# Patient Record
Sex: Female | Born: 1965 | Race: White | Hispanic: No | Marital: Single | Smoking: Former smoker
Health system: Southern US, Community
[De-identification: ages and names within clinical notes are randomized; demographics above are authoritative.]

## PROBLEM LIST (undated history)

## (undated) DIAGNOSIS — I10 Essential (primary) hypertension: Secondary | ICD-10-CM

## (undated) DIAGNOSIS — F419 Anxiety disorder, unspecified: Secondary | ICD-10-CM

## (undated) DIAGNOSIS — E669 Obesity, unspecified: Secondary | ICD-10-CM

## (undated) DIAGNOSIS — E785 Hyperlipidemia, unspecified: Secondary | ICD-10-CM

## (undated) HISTORY — PX: ABDOMINOPLASTY: SUR9

## (undated) HISTORY — DX: Hyperlipidemia, unspecified: E78.5

## (undated) HISTORY — PX: ANKLE SURGERY: SHX546

## (undated) HISTORY — DX: Obesity, unspecified: E66.9

## (undated) HISTORY — PX: BREAST REDUCTION SURGERY: SHX8

---

## 2007-09-13 ENCOUNTER — Other Ambulatory Visit: Admission: RE | Admit: 2007-09-13 | Discharge: 2007-09-13 | Payer: Self-pay | Admitting: Obstetrics and Gynecology

## 2008-03-05 ENCOUNTER — Encounter: Admission: RE | Admit: 2008-03-05 | Discharge: 2008-03-05 | Payer: Self-pay | Admitting: Obstetrics and Gynecology

## 2008-05-07 ENCOUNTER — Ambulatory Visit (HOSPITAL_BASED_OUTPATIENT_CLINIC_OR_DEPARTMENT_OTHER): Admission: RE | Admit: 2008-05-07 | Discharge: 2008-05-07 | Payer: Self-pay | Admitting: Family Medicine

## 2008-06-10 ENCOUNTER — Other Ambulatory Visit: Admission: RE | Admit: 2008-06-10 | Discharge: 2008-06-10 | Payer: Self-pay | Admitting: Interventional Radiology

## 2008-06-10 ENCOUNTER — Encounter (INDEPENDENT_AMBULATORY_CARE_PROVIDER_SITE_OTHER): Payer: Self-pay | Admitting: Interventional Radiology

## 2008-06-10 ENCOUNTER — Encounter: Admission: RE | Admit: 2008-06-10 | Discharge: 2008-06-10 | Payer: Self-pay | Admitting: Internal Medicine

## 2009-05-04 ENCOUNTER — Ambulatory Visit (HOSPITAL_BASED_OUTPATIENT_CLINIC_OR_DEPARTMENT_OTHER): Admission: RE | Admit: 2009-05-04 | Discharge: 2009-05-04 | Payer: Self-pay | Admitting: Internal Medicine

## 2009-05-04 ENCOUNTER — Ambulatory Visit: Payer: Self-pay | Admitting: Diagnostic Radiology

## 2010-07-05 ENCOUNTER — Other Ambulatory Visit: Admission: RE | Admit: 2010-07-05 | Discharge: 2010-07-05 | Payer: Self-pay | Admitting: Obstetrics and Gynecology

## 2010-08-02 ENCOUNTER — Encounter: Admission: RE | Admit: 2010-08-02 | Discharge: 2010-08-02 | Payer: Self-pay | Admitting: Internal Medicine

## 2010-10-27 ENCOUNTER — Encounter
Admission: RE | Admit: 2010-10-27 | Discharge: 2010-10-27 | Payer: Self-pay | Source: Home / Self Care | Attending: Obstetrics and Gynecology | Admitting: Obstetrics and Gynecology

## 2010-10-30 ENCOUNTER — Encounter: Payer: Self-pay | Admitting: Obstetrics and Gynecology

## 2011-01-31 ENCOUNTER — Other Ambulatory Visit (HOSPITAL_COMMUNITY): Payer: 59

## 2011-02-02 ENCOUNTER — Encounter (HOSPITAL_COMMUNITY)
Admission: RE | Admit: 2011-02-02 | Discharge: 2011-02-02 | Disposition: A | Discharge status: Home / Self Care | Payer: 59 | Source: Ambulatory Visit | Attending: Obstetrics and Gynecology | Admitting: Obstetrics and Gynecology

## 2011-02-02 LAB — COMPREHENSIVE METABOLIC PANEL
ALT: 41 U/L — ABNORMAL HIGH (ref 0–35)
Alkaline Phosphatase: 48 U/L (ref 39–117)
BUN: 11 mg/dL (ref 6–23)
CO2: 27 mEq/L (ref 19–32)
Chloride: 101 mEq/L (ref 96–112)
GFR calc non Af Amer: >60 mL/min (ref >60)
Glucose, Bld: 83 mg/dL (ref 70–99)
Potassium: 3.7 mEq/L (ref 3.5–5.1)
Sodium: 136 mEq/L (ref 135–145)
Total Bilirubin: 0.4 mg/dL (ref 0.3–1.2)
Total Protein: 7 g/dL (ref 6.0–8.3)

## 2011-02-02 LAB — CBC
HCT: 38.5 % (ref 36.0–46.0)
Hemoglobin: 13.4 g/dL (ref 12.0–15.0)
MCV: 84.2 fL (ref 78.0–100.0)
RDW: 13.8 % (ref 11.5–15.5)
WBC: 8 10*3/uL (ref 4.0–10.5)

## 2011-02-02 LAB — SURGICAL PCR SCREEN
MRSA, PCR: NEGATIVE
Staphylococcus aureus: POSITIVE — AB

## 2011-02-07 ENCOUNTER — Ambulatory Visit (HOSPITAL_COMMUNITY)
Admission: RE | Admit: 2011-02-07 | Discharge: 2011-02-07 | Disposition: A | Discharge status: Home / Self Care | Payer: 59 | Source: Ambulatory Visit | Attending: Obstetrics and Gynecology | Admitting: Obstetrics and Gynecology

## 2011-02-07 ENCOUNTER — Other Ambulatory Visit: Payer: Self-pay | Admitting: Obstetrics and Gynecology

## 2011-02-07 DIAGNOSIS — N8000 Endometriosis of the uterus, unspecified: Secondary | ICD-10-CM | POA: Insufficient documentation

## 2011-02-07 DIAGNOSIS — Z01812 Encounter for preprocedural laboratory examination: Secondary | ICD-10-CM | POA: Insufficient documentation

## 2011-02-07 DIAGNOSIS — Z01818 Encounter for other preprocedural examination: Secondary | ICD-10-CM | POA: Insufficient documentation

## 2011-02-07 DIAGNOSIS — N920 Excessive and frequent menstruation with regular cycle: Secondary | ICD-10-CM | POA: Insufficient documentation

## 2011-02-07 DIAGNOSIS — D251 Intramural leiomyoma of uterus: Secondary | ICD-10-CM | POA: Insufficient documentation

## 2011-02-16 NOTE — Op Note (Signed)
NAMEMARDELLE, PANDOLFI       ACCOUNT NO.:  [id number]  MEDICAL RECORD NO.:  [id number]           PATIENT TYPE:  O  LOCATION:  9303                          FACILITY:  [facility]  PHYSICIAN:  Judithann Villamar P. Shirleymae Hauth, M.D.DATE OF BIRTH:  [birth year]  DATE OF PROCEDURE:  02/07/2011 DATE OF DISCHARGE:  02/07/2011                              OPERATIVE REPORT   PREOPERATIVE DIAGNOSES:  Menorrhagia, uterine fibroids.  POSTOPERATIVE DIAGNOSES:  Menorrhagia, uterine fibroids, path pending.  PROCEDURE:  Robotic-assisted total laparoscopic hysterectomy.  SURGEON:  Lanee Chain P. Rivkah Wolz, MD  ASSISTANT:  Lum Keas, MD  ANESTHESIA:  General endotracheal.  ESTIMATED BLOOD LOSS:  50 mL.  COMPLICATIONS:  None.  PROCEDURE:  The patient was taken to the operating room and after the induction of adequate general endotracheal anesthesia was placed in the dorsal lithotomy position and prepped and draped in usual fashion.  A posterior weighted and anterior Deaver retractor were placed.  The cervix was grasped on its anterior lip with a single-tooth tenaculum. The uterus sounded to 10 cm.  The cervix was dilated to #23 Shawnie Pons.  A 10- cm RUMI manipulator with a size 4 KOH ring was placed around the cervix and the balloon in the manipulator inflated. A Foley catheter was then placed.  Attention was next turned to the abdomen.  A small area above the umbilicus was infiltrated with 0.25% Marcaine and incised with a knife vertically in the crease of the umbilicus.  A Veress needle was inserted into peritoneal space.  Proper placement was tested by noting a negative aspirate and free flow of saline through the Veress needle again with a negative aspirate and then by noting the response of a drop of saline placed at the hub of the Veress needle to negative pressure as the abdominal wall was elevated, pneumoperitoneum was created with 2 liters of CO2.  A 12-mm bladed trocar was then inserted into  peritoneal space and its proper placement noted with the laparoscope.  The sites for the accessory ports were marked with a marking pen and the abdomen was transilluminated to check for vasculature.  The sites were infiltrated with 0.25% Marcaine and incised.  The robotic ports were 10- cm to the right and left of the umbilicus and the assistant port in the right lower quadrant.  All the trocars were inserted under direct visualization, 8-mm robotic ports and a 12-mm assistance port.  The patient was then placed in steep Trendelenburg.  The robot was brought in and docked from the side.  In port 1, monopolar scissors were placed under direct visualization and in port 2, the PK gyrus was placed also under direct visualization.  The pelvis was inspected.  The uterus was freely mobile but did contain an approximately 4-cm fundal fibroid.  The tubes and ovaries were freely mobile and appeared normal.  The ureter was identified on the patient's right.  The procedure began on the patient's right, cauterizing the tube.  The utero-ovarian ligament and the round ligament in sequence and cutting the anterior and posterior leaves of the broad ligament were opened and taken down sharply as was the peritoneum over the bladder.  The uterine artery  was skeletonized, multiply cauterized and cut.  Attention was next turned to the patient's left.  The ureter was identified.  The utero-ovarian ligament, the tube, and the round ligament were then cauterized and cut.  Anterior and posterior leaves of the broad ligament were taken down sharply.  The uterine artery was skeletonized, multiply cauterized and cut.  The monopolar scissors were used to create a colpotomy incision circumferentially around the KOH ring.  The uterus was delivered into the vagina and left there for pneumo-occlusion.  The instruments were then switched out for a large suture cut needle driver and a Prograsp. The vagina was closed with 4  sutures of 0 Vicryl, figure-of-eight stitches with good hemostasis.  The cuff was irrigated and inspected and felt to be hemostatic.  The ureters were seen bilaterally peristalsing. Interceed was placed over the cuff and the instruments were removed from the abdomen.  The trocar sleeves were removed under direct visualization except for one at the umbilicus which was used to help expel the CO2. The nurse anesthetist gave the patient manual deep breaths and the surgeon and assistant pressed on the abdomen to expel the gas.  The sleeve was then removed.  The fascia was closed at the umbilicus in the assistance port with single sutures of 0 Vicryl.  The skin was closed subcuticularly with 4-0 Vicryl Rapide, benzoin, and Steri-Strips.  The specimen was removed from the vagina.  The vagina was wiped clean with a sponge stick.  The Foley catheter was removed and the procedure was terminated.  The patient tolerated it well and went in satisfactory condition to post anesthesia recovery.  Sponge and instrument counts were correct.     Izzah Pasqua P. Lurdes Haltiwanger, M.D.     CPR/MEDQ  D:  02/07/2011  T:  02/07/2011  Job:  [id number]  Electronically Signed by Meredeth Ide M.D. on 02/16/2011 09:58:33 AM

## 2012-08-30 ENCOUNTER — Other Ambulatory Visit: Payer: Self-pay | Admitting: Obstetrics and Gynecology

## 2012-08-30 DIAGNOSIS — Z1231 Encounter for screening mammogram for malignant neoplasm of breast: Secondary | ICD-10-CM

## 2012-08-31 ENCOUNTER — Ambulatory Visit (HOSPITAL_COMMUNITY)
Admission: RE | Admit: 2012-08-31 | Discharge: 2012-08-31 | Disposition: A | Discharge status: Home / Self Care | Payer: 59 | Source: Ambulatory Visit | Attending: Obstetrics and Gynecology | Admitting: Obstetrics and Gynecology

## 2012-08-31 DIAGNOSIS — Z1231 Encounter for screening mammogram for malignant neoplasm of breast: Secondary | ICD-10-CM | POA: Insufficient documentation

## 2013-07-03 ENCOUNTER — Emergency Department (HOSPITAL_COMMUNITY)
Admission: EM | Admit: 2013-07-03 | Discharge: 2013-07-03 | Disposition: A | Discharge status: Home / Self Care | Payer: 59 | Attending: Emergency Medicine | Admitting: Emergency Medicine

## 2013-07-03 ENCOUNTER — Encounter (HOSPITAL_COMMUNITY): Payer: Self-pay | Admitting: Nurse Practitioner

## 2013-07-03 ENCOUNTER — Emergency Department (HOSPITAL_COMMUNITY): Payer: 59

## 2013-07-03 DIAGNOSIS — R5381 Other malaise: Secondary | ICD-10-CM | POA: Insufficient documentation

## 2013-07-03 DIAGNOSIS — Z7982 Long term (current) use of aspirin: Secondary | ICD-10-CM | POA: Insufficient documentation

## 2013-07-03 DIAGNOSIS — Z79899 Other long term (current) drug therapy: Secondary | ICD-10-CM | POA: Insufficient documentation

## 2013-07-03 DIAGNOSIS — F411 Generalized anxiety disorder: Secondary | ICD-10-CM | POA: Insufficient documentation

## 2013-07-03 DIAGNOSIS — R0602 Shortness of breath: Secondary | ICD-10-CM | POA: Insufficient documentation

## 2013-07-03 DIAGNOSIS — I10 Essential (primary) hypertension: Secondary | ICD-10-CM | POA: Insufficient documentation

## 2013-07-03 DIAGNOSIS — R079 Chest pain, unspecified: Secondary | ICD-10-CM

## 2013-07-03 DIAGNOSIS — Z87891 Personal history of nicotine dependence: Secondary | ICD-10-CM | POA: Insufficient documentation

## 2013-07-03 HISTORY — DX: Anxiety disorder, unspecified: F41.9

## 2013-07-03 HISTORY — DX: Essential (primary) hypertension: I10

## 2013-07-03 LAB — POCT I-STAT TROPONIN I: Troponin i, poc: 0 ng/mL (ref 0.00–0.08)

## 2013-07-03 LAB — CBC
MCH: 29.4 pg (ref 26.0–34.0)
MCHC: 35.6 g/dL (ref 30.0–36.0)
MCV: 82.5 fL (ref 78.0–100.0)
Platelets: 274 10*3/uL (ref 150–400)
RBC: 4.56 MIL/uL (ref 3.87–5.11)

## 2013-07-03 LAB — BASIC METABOLIC PANEL
CO2: 25 mEq/L (ref 19–32)
Calcium: 9.3 mg/dL (ref 8.4–10.5)
Creatinine, Ser: 0.61 mg/dL (ref 0.50–1.10)
GFR calc non Af Amer: >90 mL/min (ref >90)
Sodium: 135 mEq/L (ref 135–145)

## 2013-07-03 NOTE — ED Notes (Signed)
Pt c/o central cp that started on Monday when she was reading a book. Pt has hx of anxiety. Pt states she took some aspirin, the pain went away but came back today. Pt denies any sob, n/v, or dizziness.

## 2013-07-03 NOTE — ED Provider Notes (Signed)
CSN: [id number]     Arrival date & time 07/03/13  1032 History   First MD Initiated Contact with Patient 07/03/13 1148     Chief Complaint  Patient presents with  . Chest Pain   (Consider location/radiation/quality/duration/timing/severity/associated sxs/prior Treatment) HPI Comments: Patient is a 47 year old female with a past medical history of anxiety and hypertension who presents with an episode of chest pain that occurred 2 days ago. Patient reports she was laying in bed reading when she had sudden onset of intermittent squeezing chest pain. The pain was located in her central chest and did not radiate. No associated symptoms. The pain was intermittent for about 3 hours before spontaneously resolving. Patient took 2 aspirin that night. Today patient was driving and had sudden onset of SOB and fatigue. These symptoms lasted about 1.5 hours before improving. Patient reports still "not feeling well" but does not have chest pain at this time. No aggravating/alleviating factors. No other associated symptoms. Patient is concerned because patient's father had his first heart attack at age 14. Patient quit smoking 1 year ago.    Past Medical History  Diagnosis Date  . Anxiety   . Hypertension    History reviewed. No pertinent past surgical history. History reviewed. No pertinent family history. History  Substance Use Topics  . Smoking status: Former Games developer  . Smokeless tobacco: Not on file  . Alcohol Use: Yes   OB History   Grav Para Term Preterm Abortions TAB SAB Ect Mult Living                 Review of Systems  Constitutional: Positive for fatigue.  Respiratory: Positive for shortness of breath.   Cardiovascular: Positive for chest pain.  All other systems reviewed and are negative.    Allergies  Review of patient's allergies indicates no known allergies.  Home Medications   Current Outpatient Rx  Name  Route  Sig  Dispense  Refill  . aspirin 325 MG tablet   Oral   Take  325 mg by mouth every 4 (four) hours as needed for pain.         . clonazePAM (KLONOPIN) 0.5 MG tablet   Oral   Take 0.5 mg by mouth 2 (two) times daily as needed for anxiety.         . DiphenhydrAMINE HCl (BENADRYL ALLERGY PO)   Oral   Take 1 tablet by mouth daily as needed (allergie).         . hydrOXYzine (ATARAX/VISTARIL) 25 MG tablet   Oral   Take 25 mg by mouth 3 (three) times daily as needed for itching.          BP 153/84  Pulse 93  Temp(Src) 98 F (36.7 C) (Oral)  Resp 18  Ht 5\' 2"  (1.575 m)  Wt 223 lb (101.152 kg)  BMI 40.78 kg/m2  SpO2 97% Physical Exam  Nursing note and vitals reviewed. Constitutional: She is oriented to person, place, and time. She appears well-developed and well-nourished. No distress.  HENT:  Head: Normocephalic and atraumatic.  Eyes: Conjunctivae and EOM are normal.  Neck: Normal range of motion.  Cardiovascular: Normal rate and regular rhythm.  Exam reveals no gallop and no friction rub.   No murmur heard. Pulmonary/Chest: Effort normal and breath sounds normal. She has no wheezes. She has no rales. She exhibits no tenderness.  Abdominal: Soft. She exhibits no distension. There is no tenderness. There is no rebound and no guarding.  Musculoskeletal: Normal  range of motion.  Neurological: She is alert and oriented to person, place, and time. Coordination normal.  Speech is goal-oriented. Moves limbs without ataxia.   Skin: Skin is warm and dry.  Psychiatric: She has a normal mood and affect. Her behavior is normal.    ED Course  Procedures (including critical care time)   Date: 07/03/2013  Rate: 93  Rhythm: normal sinus rhythm  QRS Axis: normal  Intervals: normal  ST/T Wave abnormalities: normal  Conduction Disutrbances:none  Narrative Interpretation: NSR unchanged from previous  Old EKG Reviewed: unchanged    Labs Review Labs Reviewed  CBC  BASIC METABOLIC PANEL  POCT I-STAT TROPONIN I  POCT I-STAT TROPONIN I    Imaging Review Dg Chest 2 View  07/03/2013   CLINICAL DATA:  Chest pain, shortness of breath for 2 days, history smoking, hypertension  EXAM: CHEST  2 VIEW  COMPARISON:  None  FINDINGS: Normal heart size, mediastinal contours, and pulmonary vascularity.  Lungs clear.  No pleural effusion or pneumothorax.  No acute osseous findings.  IMPRESSION: No acute abnormalities.   Electronically Signed   By: Ulyses Southward M.D.   On: 07/03/2013 11:28    MDM   1. Chest pain     12:05 PM Labs and chest xray pending. Vitals stable and patient afebrile.   2:50 PM Labs and chest xray unremarkable. I spoke with Dr. Fayrene Fearing about the patient who agrees she should have another troponin and recommended follow up with PCP for outpatient stress test. Pain does not sound cardiac at this time but should be evaluated due to family history.   4:30 PM Second troponin negative. Patient will be discharged with instructions to follow up with her PCP for outpatient stress test. Patient instructed to return to the ED with worsening or concerning symptoms. Vitals stable and patient afebrile.     Emilia Beck, [place] 07/03/13 [phone]

## 2013-07-03 NOTE — ED Notes (Addendum)
Pt reports over past weekend she felt very fatigued and had some chest pain and nausea Monday that resolved. Symptoms returned today, she noticed her BP was elevated at home, was unable to catch her breath, felt nauseated and weak. No pain today. Has anxiety but this does not feel like her anxiety and she took 2 klonopin with no change in symptoms

## 2013-07-05 ENCOUNTER — Encounter: Payer: Self-pay | Admitting: Cardiology

## 2013-07-05 ENCOUNTER — Encounter: Payer: Self-pay | Admitting: *Deleted

## 2013-07-05 DIAGNOSIS — I10 Essential (primary) hypertension: Secondary | ICD-10-CM | POA: Insufficient documentation

## 2013-07-05 DIAGNOSIS — F419 Anxiety disorder, unspecified: Secondary | ICD-10-CM | POA: Insufficient documentation

## 2013-07-05 DIAGNOSIS — E669 Obesity, unspecified: Secondary | ICD-10-CM | POA: Insufficient documentation

## 2013-07-05 DIAGNOSIS — E785 Hyperlipidemia, unspecified: Secondary | ICD-10-CM | POA: Insufficient documentation

## 2013-07-12 ENCOUNTER — Encounter: Payer: Self-pay | Admitting: Cardiology

## 2013-07-12 ENCOUNTER — Ambulatory Visit (INDEPENDENT_AMBULATORY_CARE_PROVIDER_SITE_OTHER): Payer: 59 | Admitting: Cardiology

## 2013-07-12 VITALS — BP 150/82 | HR 100 | Ht 62.0 in | Wt 230.0 lb

## 2013-07-12 DIAGNOSIS — E669 Obesity, unspecified: Secondary | ICD-10-CM

## 2013-07-12 DIAGNOSIS — R0609 Other forms of dyspnea: Secondary | ICD-10-CM

## 2013-07-12 DIAGNOSIS — R079 Chest pain, unspecified: Secondary | ICD-10-CM

## 2013-07-12 DIAGNOSIS — F419 Anxiety disorder, unspecified: Secondary | ICD-10-CM

## 2013-07-12 DIAGNOSIS — F411 Generalized anxiety disorder: Secondary | ICD-10-CM

## 2013-07-12 DIAGNOSIS — R0600 Dyspnea, unspecified: Secondary | ICD-10-CM

## 2013-07-12 NOTE — Progress Notes (Signed)
[address] [place], [place]  [address] Phone: [phone] Fax:  [phone]  Date:  07/12/2013   ID:  Kara Martinez, DOB Mar 02, 1966, MRN [id number]  PCP:  Gillermina Hu, FNP [facility]  History of Present Illness: Kara Martinez is a 47 y.o. female here for evaluation of chest pain, shortness of breath. She was seen at [facility] emergency room on 07/03/13 with chest discomfort, shortness of breath, nausea and various other complaints.Felt tingling. Clammy. Went to bed and had intermittent chest pain. Has history of anxiety attacks.  Workup was unremarkable. She was told to followup with her primary physician at the time for stress testing. Her father had myocardial infarction at age 24. In 2012 she did have an exercise treadmill test as well as echocardiogram which was unremarkable. She does have a history of anxiety but her chest pain seemed to be different than her previous anxiety symptoms. On Sunday prior to her chest discomfort, she started feeling weak in her legs, felt tired, weak, resolved. She has had itching in the past. She was diagnosed with scabies at one point. Rash has resolved. She quit smoking in October of 2014. TSH was normal.   Wt Readings from Last 3 Encounters:  07/12/13 230 lb (104.327 kg)  07/03/13 223 lb (101.152 kg)     Past Medical History  Diagnosis Date  . Anxiety   . Hypertension   . Obesity   . Hyperlipidemia     Past Surgical History  Procedure Laterality Date  . Breast reduction surgery    . Ankle surgery    . Abdominoplasty      Current Outpatient Prescriptions  Medication Sig Dispense Refill  . aspirin 325 MG tablet Take 325 mg by mouth every 4 (four) hours as needed for pain.      . clonazePAM (KLONOPIN) 1 MG tablet Take 1 mg by mouth 3 (three) times daily.      . DiphenhydrAMINE HCl (BENADRYL ALLERGY PO) Take 1 tablet by mouth daily as needed (allergie).      . hydrOXYzine (ATARAX/VISTARIL) 25 MG tablet Take 25  mg by mouth 3 (three) times daily as needed for itching.       No current facility-administered medications for this visit.   Family History  Problem Relation Age of Onset  . CAD Father   . Heart failure Father   . Diabetes Paternal Grandfather     Allergies:    Allergies  Allergen Reactions  . Cymbalta [Duloxetine Hcl]   . Viibryd [employer] Hcl]     Social History:  The patient  reports that she has quit smoking. She does not have any smokeless tobacco history on file. She reports that  drinks alcohol. She reports that she does not use illicit drugs.   Family History  Problem Relation Age of Onset  . CAD Father   . Diabetes Paternal Grandfather     ROS:  Please see the history of present illness.   Denies any strokelike symptoms, fevers, cough, chills, orthopnea, PND, rash, syncope, orthopnea.   All other systems reviewed and negative.   PHYSICAL EXAM: VS:  BP 150/82  Pulse 100  Ht 5\' 2"  (1.575 m)  Wt 230 lb (104.327 kg)  BMI 42.06 kg/m2 Well nourished, well developed, in no acute distress HEENT: normal, [place]/AT, EOMI Neck: no JVD, normal carotid upstroke, no bruit Cardiac:  normal S1, S2; RRR; no murmur Lungs:  clear to auscultation bilaterally, no  wheezing, rhonchi or rales Abd: soft, nontender, no hepatomegaly, no bruitsobese Ext: no edema, 2+ distal pulses Skin: warm and dry, tatto right ankle GU: deferred Neuro: no focal abnormalities noted, AAO x 3  EKG:  07/03/13-sinus rhythm, heart rate 93, poor R wave progression, no specific ST changes. Exercise treadmill test: 07/29/11-low-risk, 7 minutes 11 seconds, 1 mm upsloping ST segment depression in lead 2 at stress. Nondiagnostic changes, felt to be overall low-risk. Echocardiogram: 07/29/11-normal ejection fraction, trace MR/TR   ASSESSMENT AND PLAN:  47 year old female with chest pain, shortness of breath, anxiety with strong family history of coronary artery disease with her father having a myocardial  infarction at a young age.  1. Chest pain-with her father's family history of myocardial infarction at age 31, prior smoking quit one year ago, obesity, I would like to check an exercise treadmill test to ensure that she has no evidence of ischemia. If low-risk, continue with primary prevention efforts, weight loss etc. 2. Anxiety-certainly could be playing a role in her symptoms. Continue with current medications. Stress reduction strategies. 3. Obesity-continue to encourage weight loss. Fitness, exercise. 4. I will follow up with treadmill test. We will see back on an as-needed basis if treadmill reassuring.  Signed, Donato Schultz, MD [facility]  07/12/2013 1:21 PM

## 2013-07-12 NOTE — Patient Instructions (Signed)
Your physician recommends that you continue on your current medications as directed. Please refer to the Current Medication list given to you today.  Your physician has requested that you have an exercise tolerance test. For further information please visit [email or web address]. Please also follow instruction sheet, as given.  Your physician recommends that you follow-up as needed.  

## 2013-07-13 NOTE — ED Provider Notes (Signed)
Medical screening examination/treatment/procedure(s) were performed by non-physician practitioner and as supervising physician I was immediately available for consultation/collaboration.   Roney Marion, MD 07/13/13 [phone]

## 2013-08-12 ENCOUNTER — Encounter: Payer: 59 | Admitting: Cardiology

## 2013-08-12 ENCOUNTER — Ambulatory Visit (INDEPENDENT_AMBULATORY_CARE_PROVIDER_SITE_OTHER): Payer: 59 | Admitting: Cardiology

## 2013-08-12 DIAGNOSIS — R0609 Other forms of dyspnea: Secondary | ICD-10-CM

## 2013-08-12 DIAGNOSIS — R079 Chest pain, unspecified: Secondary | ICD-10-CM

## 2013-08-12 DIAGNOSIS — R0989 Other specified symptoms and signs involving the circulatory and respiratory systems: Secondary | ICD-10-CM

## 2013-08-12 NOTE — Progress Notes (Signed)
Exercise Treadmill Test  Pre-Exercise Testing Evaluation Rhythm: normal sinus  Rate: 90     Test  Exercise Tolerance Test Ordering MD: Donato Schultz, MD  Interpreting MD: Donato Schultz, MD  Unique Test No: 1  Treadmill:  1  Indication for ETT: chest pain - rule out ischemia  Contraindication to ETT: No   Stress Modality: exercise - treadmill  Cardiac Imaging Performed: non   Protocol: standard Bruce - maximal  Max BP:  185/88  Max MPHR (bpm):  176 150  MPHR obtained (bpm):  160 % MPHR obtained:  90  Reached 85% MPHR (min:sec):  6:30 Total Exercise Time (min-sec):  7:35  Workload in METS:  9.4 Borg Scale: 17  Reason ETT Terminated:  fatigue    ST Segment Analysis At Rest: normal ST segments - no evidence of significant ST depression With Exercise: no evidence of significant ST depression  Other Information Arrhythmia:  No Angina during ETT:  absent (0) Quality of ETT:  diagnostic  ETT Interpretation:  normal - no evidence of ischemia by ST analysis  Comments: Overall reassuring, low risk exercise treadmill test. 1/10, atypical, mild chest discomfort noted.  Recommendations: Exercise recommended. Increase conditioning.

## 2013-08-15 ENCOUNTER — Other Ambulatory Visit: Payer: Self-pay

## 2014-07-08 ENCOUNTER — Ambulatory Visit: Payer: 59 | Attending: Family Medicine | Admitting: Physical Therapy

## 2014-07-08 DIAGNOSIS — M545 Low back pain, unspecified: Secondary | ICD-10-CM | POA: Insufficient documentation

## 2014-07-08 DIAGNOSIS — M25559 Pain in unspecified hip: Secondary | ICD-10-CM | POA: Diagnosis not present

## 2014-07-08 DIAGNOSIS — IMO0001 Reserved for inherently not codable concepts without codable children: Secondary | ICD-10-CM | POA: Insufficient documentation

## 2014-07-17 ENCOUNTER — Ambulatory Visit: Payer: 59 | Attending: Family Medicine | Admitting: Physical Therapy

## 2014-07-17 DIAGNOSIS — M25552 Pain in left hip: Secondary | ICD-10-CM | POA: Insufficient documentation

## 2014-07-17 DIAGNOSIS — Z5189 Encounter for other specified aftercare: Secondary | ICD-10-CM | POA: Insufficient documentation

## 2014-07-17 DIAGNOSIS — M545 Low back pain: Secondary | ICD-10-CM | POA: Diagnosis not present

## 2014-07-24 ENCOUNTER — Ambulatory Visit: Payer: 59 | Admitting: Physical Therapy

## 2014-07-24 DIAGNOSIS — Z5189 Encounter for other specified aftercare: Secondary | ICD-10-CM | POA: Diagnosis not present

## 2014-07-31 ENCOUNTER — Encounter: Payer: 59 | Admitting: Physical Therapy

## 2014-08-07 ENCOUNTER — Encounter: Payer: 59 | Admitting: Physical Therapy

## 2014-08-14 ENCOUNTER — Encounter: Payer: 59 | Admitting: Physical Therapy

## 2015-09-29 ENCOUNTER — Other Ambulatory Visit: Payer: Self-pay | Admitting: Family Medicine

## 2015-09-29 DIAGNOSIS — N644 Mastodynia: Secondary | ICD-10-CM

## 2015-10-19 ENCOUNTER — Other Ambulatory Visit: Payer: Self-pay | Admitting: Family Medicine

## 2015-10-19 DIAGNOSIS — N644 Mastodynia: Secondary | ICD-10-CM

## 2015-10-21 ENCOUNTER — Ambulatory Visit
Admission: RE | Admit: 2015-10-21 | Discharge: 2015-10-21 | Disposition: A | Discharge status: Home / Self Care | Payer: 59 | Source: Ambulatory Visit | Attending: Family Medicine | Admitting: Family Medicine

## 2015-10-21 DIAGNOSIS — N644 Mastodynia: Secondary | ICD-10-CM

## 2015-12-15 ENCOUNTER — Ambulatory Visit: Payer: 59

## 2015-12-22 ENCOUNTER — Ambulatory Visit: Payer: 59

## 2015-12-29 ENCOUNTER — Ambulatory Visit: Payer: 59

## 2016-01-19 ENCOUNTER — Ambulatory Visit: Payer: 59 | Admitting: *Deleted

## 2016-04-10 IMAGING — MG MM DIAG BREAST TOMO BILATERAL
8 series · 8 of 24 positions shown · non-contrast
Comparison: Previous exam(s).

CLINICAL DATA: Inferior bilateral breast irregularities and right
lateral breast masses. History of breast reductions.

EXAM:
DIGITAL DIAGNOSTIC BILATERAL MAMMOGRAM WITH 3D TOMOSYNTHESIS AND CAD

[L CC]
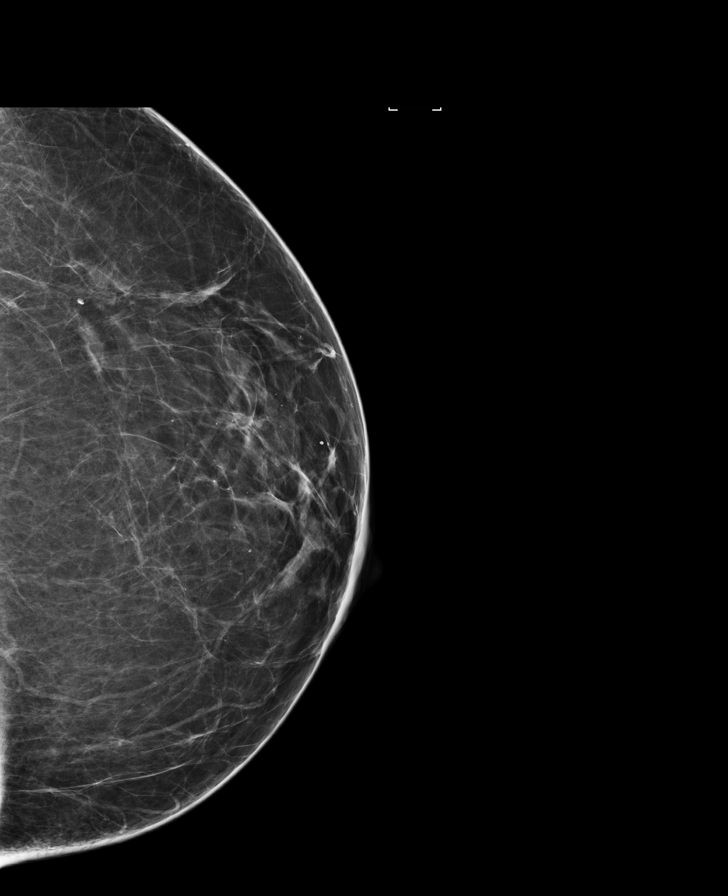

[R MLO]
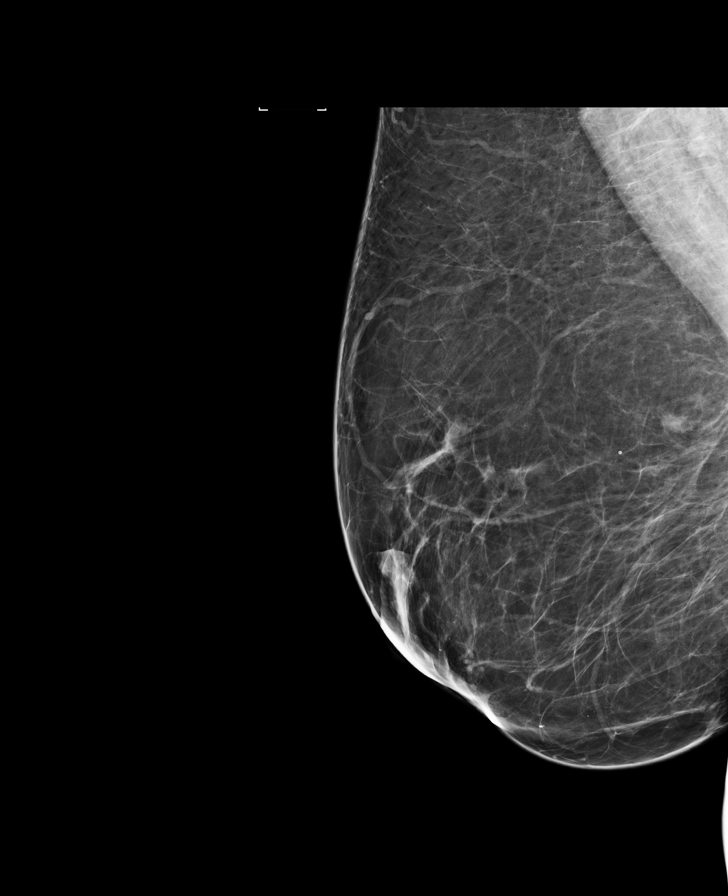

[L MLO]
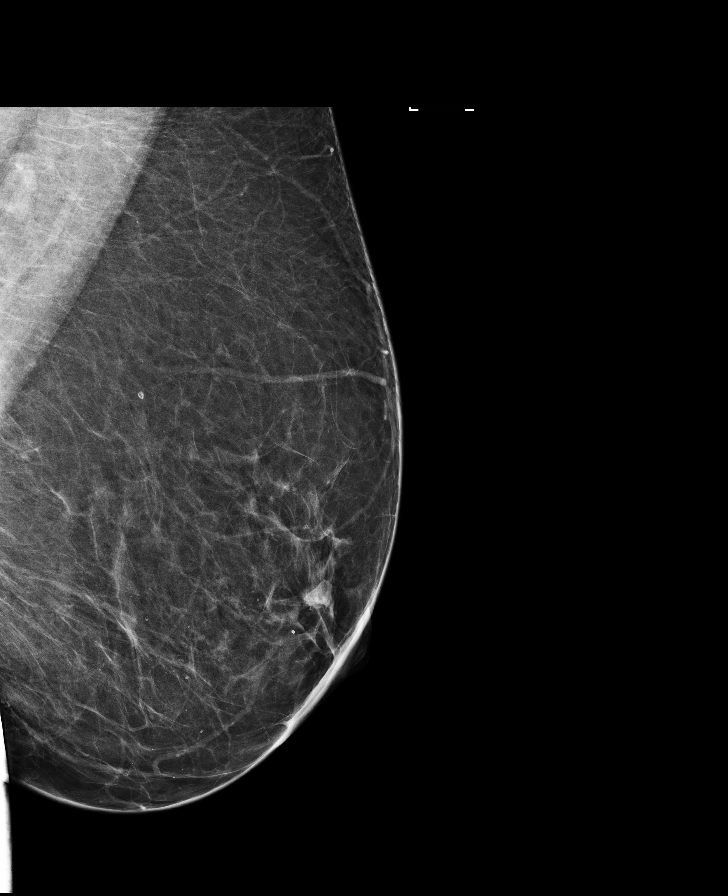

[R CC]
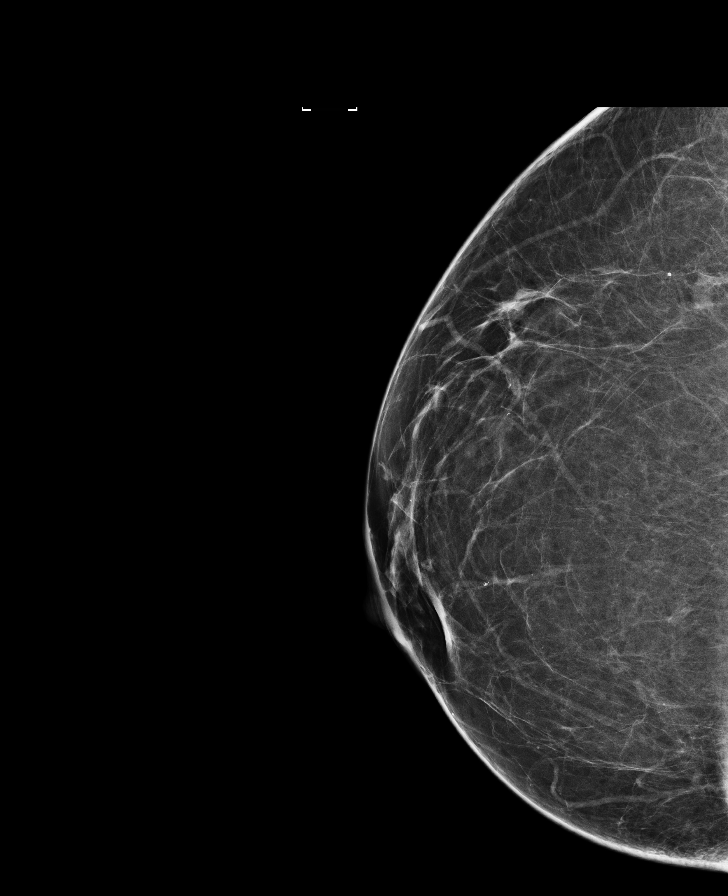

[R CC tomo · tomo slice 43/86.0]
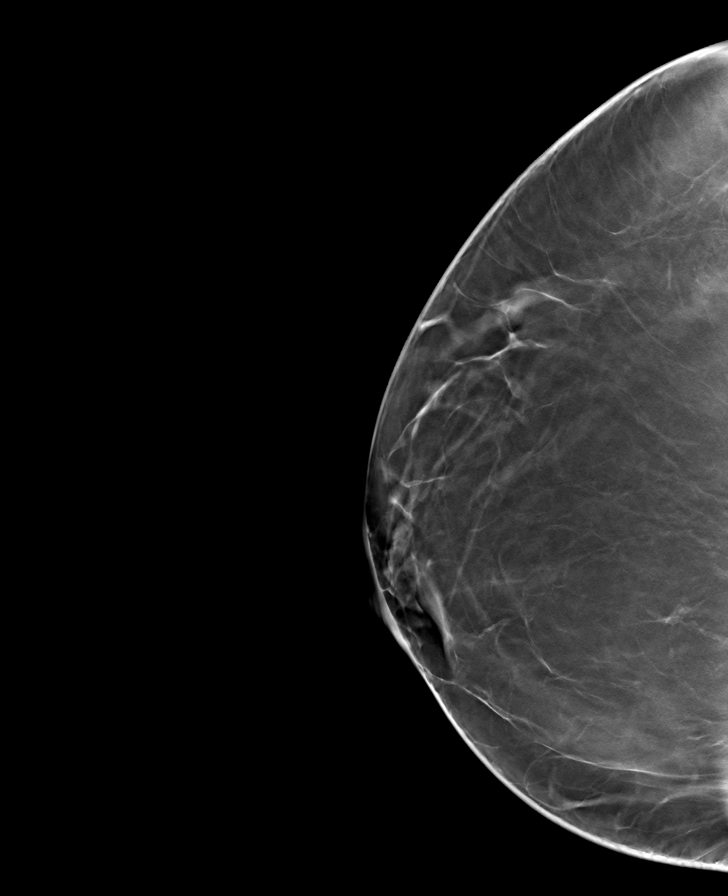

[R MLO tomo · tomo slice 49/97.0]
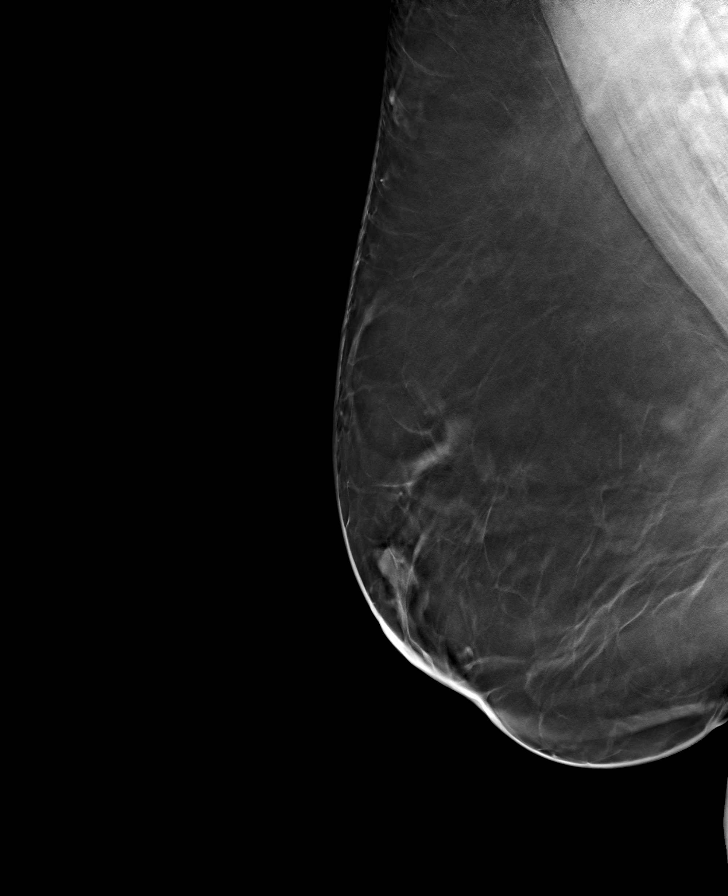

[L MLO tomo · tomo slice 53/104.0]
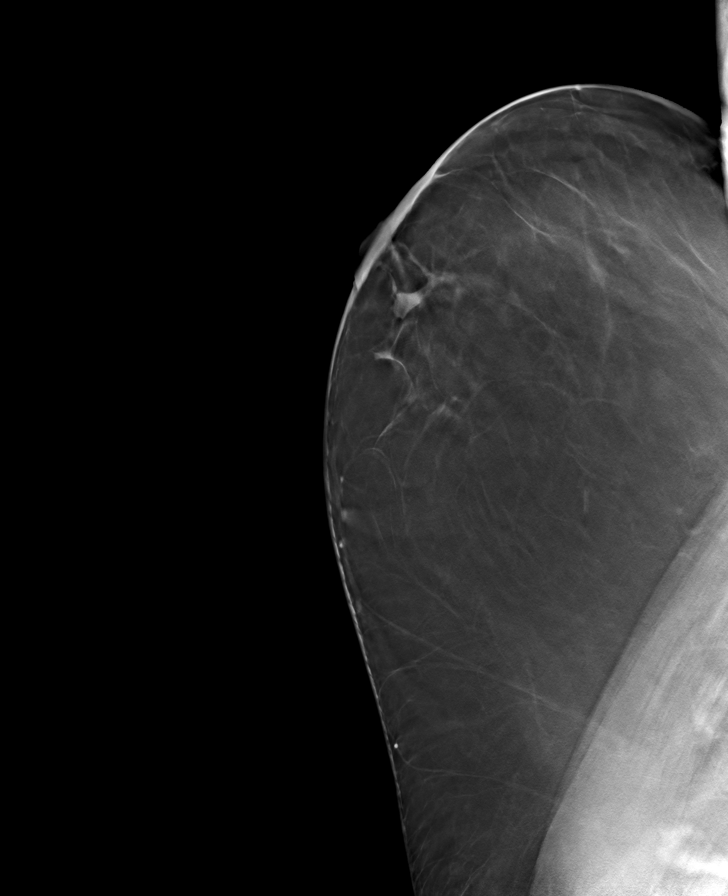

[L CC tomo · tomo slice 45/90.0]
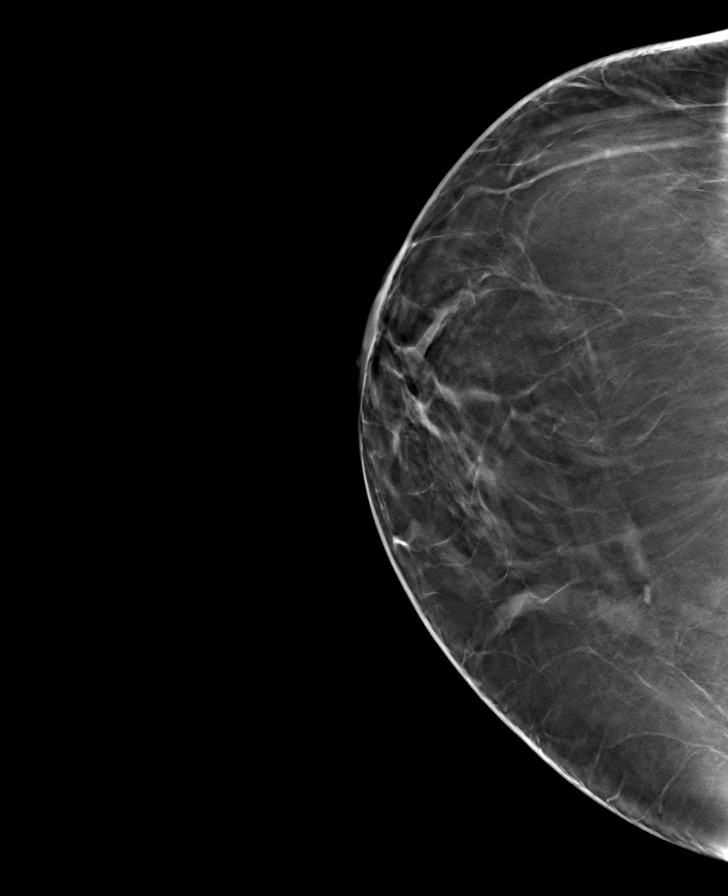

[8 of 24 positions shown; findings below may reference images not displayed]

ACR Breast Density Category b: There are scattered areas of
fibroglandular density.
FINDINGS: There are no discrete masses, areas of architectural distortion,
areas of significant asymmetry or suspicious calcifications. No
mammographic change.

Mammographic images were processed with CAD.
IMPRESSION: Normal exam.  No evidence of malignancy.

RECOMMENDATION:
Screening mammogram in one year.(Code:[id number])

I have discussed the findings and recommendations with the patient.
Results were also provided in writing at the conclusion of the
visit. If applicable, a reminder letter will be sent to the patient
regarding the next appointment.

BI-RADS CATEGORY  1: Negative.

## 2016-08-26 NOTE — Congregational Nurse Program (Signed)
Congregational Nurse Program Note  Date of Encounter  Past Medical History: Past Medical History:  Diagnosis Date  . Anxiety   . Hyperlipidemia   . Hypertension   . Obesity     Encounter Details:
